# Patient Record
Sex: Male | Born: 1990 | Race: Black or African American | Hispanic: No | Marital: Single | Smoking: Never smoker
Health system: Southern US, Community
[De-identification: ages and names within clinical notes are randomized; demographics above are authoritative.]

---

## 2013-01-19 ENCOUNTER — Emergency Department (HOSPITAL_COMMUNITY): Payer: BC Managed Care – PPO

## 2013-01-19 ENCOUNTER — Emergency Department (HOSPITAL_COMMUNITY)
Admission: EM | Admit: 2013-01-19 | Discharge: 2013-01-19 | Disposition: A | Discharge status: Home / Self Care | Payer: BC Managed Care – PPO | Attending: Emergency Medicine | Admitting: Emergency Medicine

## 2013-01-19 ENCOUNTER — Encounter (HOSPITAL_COMMUNITY): Payer: Self-pay | Admitting: *Deleted

## 2013-01-19 DIAGNOSIS — S3981XA Other specified injuries of abdomen, initial encounter: Secondary | ICD-10-CM | POA: Insufficient documentation

## 2013-01-19 DIAGNOSIS — Y92410 Unspecified street and highway as the place of occurrence of the external cause: Secondary | ICD-10-CM | POA: Insufficient documentation

## 2013-01-19 DIAGNOSIS — S43401A Unspecified sprain of right shoulder joint, initial encounter: Secondary | ICD-10-CM

## 2013-01-19 DIAGNOSIS — S62009A Unspecified fracture of navicular [scaphoid] bone of unspecified wrist, initial encounter for closed fracture: Secondary | ICD-10-CM | POA: Insufficient documentation

## 2013-01-19 DIAGNOSIS — S62001A Unspecified fracture of navicular [scaphoid] bone of right wrist, initial encounter for closed fracture: Secondary | ICD-10-CM

## 2013-01-19 DIAGNOSIS — S3991XA Unspecified injury of abdomen, initial encounter: Secondary | ICD-10-CM

## 2013-01-19 DIAGNOSIS — Y9389 Activity, other specified: Secondary | ICD-10-CM | POA: Insufficient documentation

## 2013-01-19 DIAGNOSIS — S62346A Nondisplaced fracture of base of fifth metacarpal bone, right hand, initial encounter for closed fracture: Secondary | ICD-10-CM

## 2013-01-19 DIAGNOSIS — S161XXA Strain of muscle, fascia and tendon at neck level, initial encounter: Secondary | ICD-10-CM

## 2013-01-19 DIAGNOSIS — R748 Abnormal levels of other serum enzymes: Secondary | ICD-10-CM | POA: Insufficient documentation

## 2013-01-19 DIAGNOSIS — S060X9A Concussion with loss of consciousness of unspecified duration, initial encounter: Secondary | ICD-10-CM | POA: Insufficient documentation

## 2013-01-19 DIAGNOSIS — S139XXA Sprain of joints and ligaments of unspecified parts of neck, initial encounter: Secondary | ICD-10-CM | POA: Insufficient documentation

## 2013-01-19 DIAGNOSIS — S20219A Contusion of unspecified front wall of thorax, initial encounter: Secondary | ICD-10-CM | POA: Insufficient documentation

## 2013-01-19 DIAGNOSIS — S62309A Unspecified fracture of unspecified metacarpal bone, initial encounter for closed fracture: Secondary | ICD-10-CM | POA: Insufficient documentation

## 2013-01-19 DIAGNOSIS — IMO0002 Reserved for concepts with insufficient information to code with codable children: Secondary | ICD-10-CM | POA: Insufficient documentation

## 2013-01-19 LAB — COMPREHENSIVE METABOLIC PANEL
AST: 50 U/L — ABNORMAL HIGH (ref 0–37)
CO2: 26 mEq/L (ref 19–32)
Calcium: 9.8 mg/dL (ref 8.4–10.5)
Creatinine, Ser: 1.12 mg/dL (ref 0.50–1.35)
GFR calc Af Amer: >90 mL/min (ref >90)
GFR calc non Af Amer: >90 mL/min (ref >90)
Total Protein: 7.6 g/dL (ref 6.0–8.3)

## 2013-01-19 LAB — CBC WITH DIFFERENTIAL/PLATELET
Basophils Absolute: 0.1 10*3/uL (ref 0.0–0.1)
Eosinophils Absolute: 0.1 10*3/uL (ref 0.0–0.7)
Eosinophils Relative: 1 % (ref 0–5)
HCT: 43.5 % (ref 39.0–52.0)
Lymphocytes Relative: 32 % (ref 12–46)
MCH: 29.7 pg (ref 26.0–34.0)
MCHC: 35.9 g/dL (ref 30.0–36.0)
MCV: 82.7 fL (ref 78.0–100.0)
Monocytes Absolute: 0.7 10*3/uL (ref 0.1–1.0)
Platelets: 217 10*3/uL (ref 150–400)
RDW: 12.8 % (ref 11.5–15.5)

## 2013-01-19 MED ORDER — OXYCODONE-ACETAMINOPHEN 5-325 MG PO TABS: 2.0000 | ORAL_TABLET | Freq: Four times a day (QID) | ORAL | Status: AC | PRN

## 2013-01-19 MED ORDER — FENTANYL CITRATE 0.05 MG/ML IJ SOLN
100.0000 ug | Freq: Once | INTRAMUSCULAR | Status: AC
  Administered 2013-01-19: 100 ug via INTRAVENOUS

## 2013-01-19 MED ORDER — SODIUM CHLORIDE 0.9 % IV SOLN: INTRAVENOUS | Status: DC

## 2013-01-19 MED ORDER — IOHEXOL 300 MG/ML  SOLN
100.0000 mL | Freq: Once | INTRAMUSCULAR | Status: AC | PRN
  Administered 2013-01-19: 100 mL via INTRAVENOUS

## 2013-01-19 MED ORDER — FENTANYL CITRATE 0.05 MG/ML IJ SOLN
INTRAMUSCULAR | Status: AC
  Filled 2013-01-19: qty 2

## 2013-01-19 MED ORDER — SODIUM CHLORIDE 0.9 % IV BOLUS (SEPSIS)
1000.0000 mL | Freq: Once | INTRAVENOUS | Status: AC
  Administered 2013-01-19: 1000 mL via INTRAVENOUS
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
## 2013-01-19 NOTE — Progress Notes (Signed)
Orthopedic Tech Progress Note Patient Details:  James Leach 1990/10/26 [id number]  Ortho Devices Type of Ortho Device: Ace wrap;Thumb velcro splint;Arm sling Ortho Device/Splint Location: RUE Ortho Device/Splint Interventions: Ordered;Application   [name] 01/19/2013, 7:13 PM

## 2013-01-19 NOTE — ED Notes (Signed)
C-collar removed per ED MD.

## 2013-01-19 NOTE — ED Notes (Signed)
Ortho tech at bedside 
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
## 2013-01-19 NOTE — Progress Notes (Signed)
Orthopedic Tech Progress Note Patient Details:  James Leach [birth year] [id number]  Ortho Devices Type of Ortho Device: Ace wrap;Arm sling;Thumb spica splint Ortho Device/Splint Location: RUE Ortho Device/Splint Interventions: Ordered;Application   [name] 01/19/2013, 7:14 PM

## 2013-01-19 NOTE — ED Notes (Signed)
Transported to X ray & CT scan
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
